# Patient Record
Sex: Male | Born: 1990 | Hispanic: Yes | Marital: Married | Smoking: Never smoker
Health system: Southern US, Community
[De-identification: ages and names within clinical notes are randomized; demographics above are authoritative.]

## PROBLEM LIST (undated history)

## (undated) DIAGNOSIS — J45909 Unspecified asthma, uncomplicated: Secondary | ICD-10-CM

---

## 2015-05-30 ENCOUNTER — Other Ambulatory Visit: Payer: Self-pay | Admitting: Internal Medicine

## 2015-05-30 ENCOUNTER — Ambulatory Visit
Admission: RE | Admit: 2015-05-30 | Discharge: 2015-05-30 | Disposition: A | Discharge status: Home / Self Care | Payer: BLUE CROSS/BLUE SHIELD | Source: Ambulatory Visit | Attending: Cardiology | Admitting: Cardiology

## 2015-05-30 ENCOUNTER — Ambulatory Visit
Admission: RE | Admit: 2015-05-30 | Discharge: 2015-05-30 | Disposition: A | Discharge status: Home / Self Care | Payer: BLUE CROSS/BLUE SHIELD | Source: Ambulatory Visit | Attending: Internal Medicine | Admitting: Internal Medicine

## 2015-05-30 DIAGNOSIS — M5440 Lumbago with sciatica, unspecified side: Secondary | ICD-10-CM

## 2015-05-30 DIAGNOSIS — M545 Low back pain: Secondary | ICD-10-CM | POA: Insufficient documentation

## 2016-01-05 ENCOUNTER — Encounter: Payer: Self-pay | Admitting: Gynecology

## 2016-01-05 ENCOUNTER — Ambulatory Visit
Admission: EM | Admit: 2016-01-05 | Discharge: 2016-01-05 | Disposition: A | Discharge status: Home / Self Care | Payer: BLUE CROSS/BLUE SHIELD | Attending: Family Medicine | Admitting: Family Medicine

## 2016-01-05 ENCOUNTER — Ambulatory Visit (INDEPENDENT_AMBULATORY_CARE_PROVIDER_SITE_OTHER): Payer: BLUE CROSS/BLUE SHIELD

## 2016-01-05 DIAGNOSIS — J40 Bronchitis, not specified as acute or chronic: Secondary | ICD-10-CM

## 2016-01-05 DIAGNOSIS — J069 Acute upper respiratory infection, unspecified: Secondary | ICD-10-CM

## 2016-01-05 HISTORY — DX: Unspecified asthma, uncomplicated: J45.909

## 2016-01-05 LAB — RAPID STREP SCREEN (MED CTR MEBANE ONLY): STREPTOCOCCUS, GROUP A SCREEN (DIRECT): NEGATIVE

## 2016-01-05 LAB — RAPID INFLUENZA A&B ANTIGENS (ARMC ONLY)
INFLUENZA A (ARMC): NEGATIVE
INFLUENZA B (ARMC): NEGATIVE

## 2016-01-05 MED ORDER — ALBUTEROL SULFATE HFA 108 (90 BASE) MCG/ACT IN AERS: 1.0000 | INHALATION_SPRAY | Freq: Four times a day (QID) | RESPIRATORY_TRACT | 0 refills | Status: DC | PRN

## 2016-01-05 MED ORDER — AZITHROMYCIN 250 MG PO TABS: 250.0000 mg | ORAL_TABLET | Freq: Every day | ORAL | 0 refills | Status: DC

## 2016-01-05 NOTE — ED Provider Notes (Signed)
CSN: [id number]     Arrival date & time 01/05/16  1419 History   First MD Initiated Contact with Patient 01/05/16 1503     Chief Complaint  Patient presents with  . Cough   (Consider location/radiation/quality/duration/timing/severity/associated sxs/prior Treatment) Pt has been sick with cough congestion wheezing for a weeks, not getting any better. States that he has been having productive yellow/ green phlem. States that he just does not feel good. Has been taking day quil with minimal relief. Has been running low grade fevers of 99 at home. Did say some guys at work have also been sick. He has been in the hospital due to his wife just had a baby 2 weeks ago when this began.        Past Medical History:  Diagnosis Date  . Asthma    No past surgical history on file. History reviewed. No pertinent family history. Social History  Substance Use Topics  . Smoking status: Never Smoker  . Smokeless tobacco: Never Used  . Alcohol use No    Review of Systems  Constitutional: Negative.   HENT: Positive for congestion, postnasal drip, rhinorrhea and sinus pressure.   Eyes: Negative.   Respiratory: Positive for cough, shortness of breath and wheezing.   Cardiovascular: Negative.   Neurological: Negative.     Allergies  Review of patient's allergies indicates no known allergies.  Home Medications   Prior to Admission medications   Medication Sig Start Date End Date Taking? Authorizing Provider  cyclobenzaprine (FLEXERIL) 10 MG tablet Take 10 mg by mouth 3 (three) times daily as needed for muscle spasms.   Yes Historical Provider, MD  albuterol (PROVENTIL HFA;VENTOLIN HFA) 108 (90 Base) MCG/ACT inhaler Inhale 1-2 puffs into the lungs every 6 (six) hours as needed for wheezing or shortness of breath. 01/05/16   Tobi Bastos, NP  azithromycin (ZITHROMAX) 250 MG tablet Take 1 tablet (250 mg total) by mouth daily. Take first 2 tablets together, then 1 every day until finished.  01/05/16   Tobi Bastos, NP   Meds Ordered and Administered this Visit  Medications - No data to display  BP 119/72 (BP Location: Left Arm)   Pulse 65   Temp 98.2 F (36.8 C) (Oral)   Resp 16   Ht 5\' 10"  (1.778 m)   Wt 185 lb (83.9 kg)   SpO2 98%   BMI 26.54 kg/m  No data found.   Physical Exam  Constitutional: He is oriented to person, place, and time. He appears well-developed and well-nourished.  HENT:  Head: Normocephalic.  Nose: Nose normal.  Eyes: EOM are normal. Pupils are equal, round, and reactive to light.  Cardiovascular: Normal rate and regular rhythm.   Pulmonary/Chest: He has wheezes.  Productive cough yellow, sob,   Neurological: He is oriented to person, place, and time.  Skin: Skin is warm.    Urgent Care Course   Clinical Course    Procedures (including critical care time)  Labs Review Labs Reviewed  RAPID INFLUENZA A&B ANTIGENS ([facility] ONLY)  RAPID STREP SCREEN (NOT AT [facility])  CULTURE, GROUP A STREP [facility])    Imaging Review Dg Chest 2 View  Result Date: 01/05/2016 CLINICAL DATA:  Pt with wheezing and cough x four days. No hx of trauma, injury or surgery. EXAM: CHEST  2 VIEW COMPARISON:  None. FINDINGS: The heart size and mediastinal contours are within normal limits. Both lungs are clear. No pleural effusion or pneumothorax. The visualized skeletal structures are unremarkable. IMPRESSION:  No active cardiopulmonary disease. Electronically Signed   By: Amie Portlandavid  Ormond M.D.   On: 01/05/2016 15:35     Visual Acuity Review          MDM   1. Acute upper respiratory infection   2. Bronchitis    Discussed using humidifier and inhaler to help with sob May use otc cough medication as needed Take full does of abx  X ray did not show any infection      Tobi BastosMelanie A Emerita Berkemeier, NP 01/05/16 1542

## 2016-01-05 NOTE — ED Triage Notes (Signed)
Per patient cough / chills / sore throat / yellowish,greensih mucous x 5 days.

## 2016-01-08 LAB — CULTURE, GROUP A STREP (THRC)

## 2016-09-22 ENCOUNTER — Encounter: Payer: Self-pay | Admitting: Emergency Medicine

## 2016-09-22 ENCOUNTER — Emergency Department: Payer: Self-pay

## 2016-09-22 ENCOUNTER — Emergency Department
Admission: EM | Admit: 2016-09-22 | Discharge: 2016-09-22 | Disposition: A | Discharge status: Home / Self Care | Payer: Self-pay | Attending: Emergency Medicine | Admitting: Emergency Medicine

## 2016-09-22 DIAGNOSIS — R197 Diarrhea, unspecified: Secondary | ICD-10-CM | POA: Insufficient documentation

## 2016-09-22 DIAGNOSIS — R112 Nausea with vomiting, unspecified: Secondary | ICD-10-CM

## 2016-09-22 DIAGNOSIS — R630 Anorexia: Secondary | ICD-10-CM

## 2016-09-22 DIAGNOSIS — R109 Unspecified abdominal pain: Secondary | ICD-10-CM

## 2016-09-22 LAB — COMPREHENSIVE METABOLIC PANEL
ALT: 23 U/L (ref 17–63)
ANION GAP: 7 (ref 5–15)
AST: 21 U/L (ref 15–41)
Albumin: 4.5 g/dL (ref 3.5–5.0)
Alkaline Phosphatase: 54 U/L (ref 38–126)
BUN: 11 mg/dL (ref 6–20)
CHLORIDE: 104 mmol/L (ref 101–111)
CO2: 27 mmol/L (ref 22–32)
CREATININE: 0.89 mg/dL (ref 0.61–1.24)
Calcium: 9.2 mg/dL (ref 8.9–10.3)
Glucose, Bld: 97 mg/dL (ref 65–99)
POTASSIUM: 3.8 mmol/L (ref 3.5–5.1)
SODIUM: 138 mmol/L (ref 135–145)
Total Bilirubin: 0.6 mg/dL (ref 0.3–1.2)
Total Protein: 7.5 g/dL (ref 6.5–8.1)

## 2016-09-22 LAB — CBC
HEMATOCRIT: 41.7 % (ref 40.0–52.0)
HEMOGLOBIN: 14.3 g/dL (ref 13.0–18.0)
MCH: 31.6 pg (ref 26.0–34.0)
MCHC: 34.3 g/dL (ref 32.0–36.0)
MCV: 92.2 fL (ref 80.0–100.0)
PLATELETS: 212 10*3/uL (ref 150–440)
RBC: 4.53 MIL/uL (ref 4.40–5.90)
RDW: 13.2 % (ref 11.5–14.5)
WBC: 6.5 10*3/uL (ref 3.8–10.6)

## 2016-09-22 LAB — URINALYSIS, COMPLETE (UACMP) WITH MICROSCOPIC
BACTERIA UA: NONE SEEN
BILIRUBIN URINE: NEGATIVE
Glucose, UA: NEGATIVE mg/dL
Hgb urine dipstick: NEGATIVE
KETONES UR: NEGATIVE mg/dL
Nitrite: NEGATIVE
PH: 6 (ref 5.0–8.0)
PROTEIN: NEGATIVE mg/dL
Specific Gravity, Urine: 1.009 (ref 1.005–1.030)

## 2016-09-22 LAB — LIPASE, BLOOD: LIPASE: 34 U/L (ref 11–51)

## 2016-09-22 MED ORDER — IOPAMIDOL (ISOVUE-300) INJECTION 61%
30.0000 mL | Freq: Once | INTRAVENOUS | Status: AC | PRN
  Administered 2016-09-22: 30 mL via ORAL

## 2016-09-22 MED ORDER — HYDROMORPHONE HCL 1 MG/ML IJ SOLN
0.5000 mg | Freq: Once | INTRAMUSCULAR | Status: AC
  Administered 2016-09-22: 0.5 mg via INTRAVENOUS
  Filled 2016-09-22: qty 1

## 2016-09-22 MED ORDER — LOPERAMIDE HCL 2 MG PO TABS: 2.0000 mg | ORAL_TABLET | Freq: Four times a day (QID) | ORAL | 0 refills | Status: DC | PRN

## 2016-09-22 MED ORDER — SODIUM CHLORIDE 0.9 % IV BOLUS (SEPSIS)
1000.0000 mL | Freq: Once | INTRAVENOUS | Status: AC
  Administered 2016-09-22: 1000 mL via INTRAVENOUS

## 2016-09-22 MED ORDER — IOPAMIDOL (ISOVUE-300) INJECTION 61%
100.0000 mL | Freq: Once | INTRAVENOUS | Status: AC | PRN
  Administered 2016-09-22: 100 mL via INTRAVENOUS

## 2016-09-22 MED ORDER — ONDANSETRON HCL 4 MG PO TABS: 4.0000 mg | ORAL_TABLET | Freq: Every day | ORAL | 0 refills | Status: AC | PRN

## 2016-09-22 MED ORDER — ONDANSETRON HCL 4 MG/2ML IJ SOLN
4.0000 mg | Freq: Once | INTRAMUSCULAR | Status: AC
  Administered 2016-09-22: 4 mg via INTRAVENOUS
  Filled 2016-09-22: qty 2

## 2016-09-22 MED ORDER — ONDANSETRON HCL 4 MG PO TABS: 4.0000 mg | ORAL_TABLET | Freq: Every day | ORAL | 1 refills | Status: DC | PRN

## 2016-09-22 NOTE — ED Provider Notes (Signed)
[facility] Emergency Department Provider Note  ____________________________________________  Time seen: Approximately 12:09 PM  I have reviewed the triage vital signs and the nursing notes.   HISTORY  Chief Complaint Emesis and Abdominal Pain    HPI Martin Burch is a 26 y.o. male, otherwise healthy without any history of abdominal surgeries in the past, presenting with abdominal pain, nausea and vomiting, diarrhea. The patient reports that on Saturday night, he ate at a restaurant where he was served oysters, lobster tail, and alligator. At approximately 2:30 AM, the patient started having vomiting and several hours later developed right-sided abdominal pain, mostly in the right upper quadrant. He had multiple episodes of vomiting throughout the day, which resolved by 6 PM last night. He also had several episodes of nonbloody diarrhea. The patient is here today, because of ongoing anorexia and pain. No fevers, chills, dysuria, testicular or scrotal symptoms.   Past Medical History:  Diagnosis Date  . Asthma     There are no active problems to display for this patient.   History reviewed. No pertinent surgical history.  Current Outpatient Rx  . Order #: [id number] Class: Normal  . Order #: [id number] Class: Normal  . Order #: [id number] Class: Print  . Order #: [id number] Class: Print    Allergies Patient has no known allergies.  No family history on file.  Social History Social History  Substance Use Topics  . Smoking status: Never Smoker  . Smokeless tobacco: Never Used  . Alcohol use No    Review of Systems Constitutional: No fever/chills.Lightheadedness or syncope. Eyes: No visual changes. ENT: No sore throat. No congestion or rhinorrhea. Cardiovascular: Denies chest pain. Denies palpitations. Respiratory: Denies shortness of breath.  No cough. Gastrointestinal: Positive right-sided abdominal pain.  Positive nausea, positive vomiting.   Positive diarrhea.  No constipation. Genitourinary: Negative for dysuria. No testicular or scrotal pain. Musculoskeletal: Negative for back pain. Skin: Negative for rash. Neurological: Negative for headaches. No focal numbness, tingling or weakness.     ____________________________________________   PHYSICAL EXAM:  VITAL SIGNS: ED Triage Vitals  Enc Vitals Group     BP 09/22/16 1139 121/84     Pulse Rate 09/22/16 1139 70     Resp 09/22/16 1139 15     Temp 09/22/16 1139 98 F (36.7 C)     Temp Source 09/22/16 1139 Oral     SpO2 09/22/16 1139 100 %     Weight 09/22/16 1140 190 lb (86.2 kg)     Height 09/22/16 1140 5\' 9"  (1.753 m)     Head Circumference --      Peak Flow --      Pain Score 09/22/16 1139 5     Pain Loc --      Pain Edu? --      Excl. in GC? --     Constitutional: Alert and oriented. Mildly uncomfortable appearing but nontoxic. Answers questions appropriately. Eyes: Conjunctivae are normal.  EOMI. No scleral icterus. Head: Atraumatic. Nose: No congestion/rhinnorhea. Mouth/Throat: Mucous membranes are moist.  Neck: No stridor.  Supple.  No meningismus. Cardiovascular: Normal rate, regular rhythm. No murmurs, rubs or gallops.  Respiratory: Normal respiratory effort.  No accessory muscle use or retractions. Lungs CTAB.  No wheezes, rales or ronchi. Gastrointestinal: Soft, and nondistended.  Tender to palpation in the right lower quadrant greater than the right upper quadrant. Negative Murphy sign. No guarding or rebound.  No peritoneal signs. Genitourinary: Deferred as the patient is not having any symptoms. Musculoskeletal: No LE  edema.  Neurologic:  A&Ox3.  Speech is clear.  Face and smile are symmetric.  EOMI.  Moves all extremities well. Skin:  Skin is warm, dry and intact. No rash noted. Psychiatric: Mood and affect are normal. Speech and behavior are normal.  Normal judgement.  ____________________________________________   LABS (all labs ordered are  listed, but only abnormal results are displayed)  Labs Reviewed  URINALYSIS, COMPLETE (UACMP) WITH MICROSCOPIC - Abnormal; Notable for the following:       Result Value   Color, Urine YELLOW (*)    APPearance CLEAR (*)    Leukocytes, UA TRACE (*)    Squamous Epithelial / LPF 0-5 (*)    All other components within normal limits  LIPASE, BLOOD  COMPREHENSIVE METABOLIC PANEL  CBC   ____________________________________________  EKG  Not indicated ____________________________________________  RADIOLOGY  Ct Abdomen Pelvis W Contrast  Result Date: 09/22/2016 CLINICAL DATA:  26 y/o  M; right upper quadrant pain. EXAM: CT ABDOMEN AND PELVIS WITH CONTRAST TECHNIQUE: Multidetector CT imaging of the abdomen and pelvis was performed using the standard protocol following bolus administration of intravenous contrast. CONTRAST:  ISOVUE-300 IOPAMIDOL (ISOVUE-300) INJECTION 61% COMPARISON:  None. FINDINGS: Lower chest: No acute abnormality. Hepatobiliary: No focal liver abnormality is seen. No gallstones, gallbladder wall thickening, or biliary dilatation. Pancreas: Unremarkable. No pancreatic ductal dilatation or surrounding inflammatory changes. Spleen: Normal in size without focal abnormality. Adrenals/Urinary Tract: Adrenal glands are unremarkable. Kidneys are normal, without renal calculi, focal lesion, or hydronephrosis. Bladder is unremarkable. Stomach/Bowel: Stomach is within normal limits. Appendix appears normal. No evidence of bowel wall thickening, distention, or inflammatory changes. Vascular/Lymphatic: No significant vascular findings are present. No enlarged abdominal or pelvic lymph nodes. Reproductive: Prostate is unremarkable. Other: No abdominal wall hernia or abnormality. No abdominopelvic ascites. Musculoskeletal: No acute or significant osseous findings. IMPRESSION: No acute process identified as explanation for pain. Unremarkable CT of abdomen and pelvis. Electronically Signed    By: Mitzi Hansen M.D.   On: 09/22/2016 14:09    ____________________________________________   PROCEDURES  Procedure(s) performed: None  Procedures  Critical Care performed: No ____________________________________________   INITIAL IMPRESSION / ASSESSMENT AND PLAN / ED COURSE  Pertinent labs & imaging results that were available during my care of the patient were reviewed by me and considered in my medical decision making (see chart for details).  26 y.o. male presenting with nausea vomiting and diarrhea, right lower quadrant greater than right upper quadrant pain. Overall, the patient is hemodynamically stable and afebrile. Given his abdominal examination, I'm concerned about gallbladder as well as appendix pathology. Will get a CT scan for further evaluation. I'm awaiting the results of his laboratory studies. In the meantime, I'll treat him symptomatically and plan reevaluation for final disposition.  ----------------------------------------- 2:34 PM on 09/22/2016 -----------------------------------------  The patient's workup has been reassuring in the emergency department, his pain completely resolved after 1 dose of pain medication. He has been able to tolerate liquid without any difficulty, and has not had any further diarrheal episodes. His CT abdomen does not show any acute abdominal process. Plan discharge at this time. Return precautions were discussed.  ____________________________________________  FINAL CLINICAL IMPRESSION(S) / ED DIAGNOSES  Final diagnoses:  Nausea vomiting and diarrhea  Right sided abdominal pain  Anorexia         NEW MEDICATIONS STARTED DURING THIS VISIT:  Current Discharge Medication List    START taking these medications   Details  loperamide (IMODIUM A-D) 2 MG tablet Take  1 tablet (2 mg total) by mouth 4 (four) times daily as needed for diarrhea or loose stools. Qty: 12 tablet, Refills: 0    ondansetron (ZOFRAN) 4 MG  tablet Take 1 tablet (4 mg total) by mouth daily as needed for nausea or vomiting. Qty: 12 tablet, Refills: 0          Rockne MenghiniNorman, Anne-Caroline, MD 09/22/16 1436

## 2016-09-22 NOTE — ED Notes (Signed)
Pt returned from CT, resting in bed in no distress 

## 2016-09-22 NOTE — ED Notes (Signed)
Patient transported to CT 

## 2016-09-22 NOTE — ED Notes (Signed)
Pt resting in bed, denies any needs, states relief with pain meds, resp even and unlabored

## 2016-09-22 NOTE — ED Triage Notes (Signed)
Reports emesis since Sunday, RUQ pain. Pt reports symptoms have somewhat resolved but still continues to have decreased appetite, reports RUQ pain continues.

## 2016-09-22 NOTE — Discharge Instructions (Signed)
Please take a clear liquid diet for the next 24 hours, then advance to a bland diet as tolerated. You may take Zofran for nausea and vomiting, and loperamide is for diarrhea.  Return to the emergency department if you develop severe pain, lightheadedness or fainting, fever, inability to keep down fluids, or any other symptoms concerning to you

## 2017-05-31 ENCOUNTER — Emergency Department: Payer: BLUE CROSS/BLUE SHIELD

## 2017-05-31 ENCOUNTER — Encounter: Payer: Self-pay | Admitting: Emergency Medicine

## 2017-05-31 ENCOUNTER — Other Ambulatory Visit: Payer: Self-pay

## 2017-05-31 ENCOUNTER — Emergency Department
Admission: EM | Admit: 2017-05-31 | Discharge: 2017-05-31 | Disposition: A | Discharge status: Home / Self Care | Payer: BLUE CROSS/BLUE SHIELD | Attending: Emergency Medicine | Admitting: Emergency Medicine

## 2017-05-31 DIAGNOSIS — R001 Bradycardia, unspecified: Secondary | ICD-10-CM | POA: Diagnosis not present

## 2017-05-31 DIAGNOSIS — R079 Chest pain, unspecified: Secondary | ICD-10-CM | POA: Diagnosis not present

## 2017-05-31 DIAGNOSIS — J45909 Unspecified asthma, uncomplicated: Secondary | ICD-10-CM | POA: Diagnosis not present

## 2017-05-31 LAB — TROPONIN I

## 2017-05-31 LAB — CBC
HCT: 40.5 % (ref 40.0–52.0)
HEMOGLOBIN: 13.6 g/dL (ref 13.0–18.0)
MCH: 32.2 pg (ref 26.0–34.0)
MCHC: 33.6 g/dL (ref 32.0–36.0)
MCV: 95.7 fL (ref 80.0–100.0)
PLATELETS: 221 10*3/uL (ref 150–440)
RBC: 4.23 MIL/uL — ABNORMAL LOW (ref 4.40–5.90)
RDW: 13.3 % (ref 11.5–14.5)
WBC: 5.9 10*3/uL (ref 3.8–10.6)

## 2017-05-31 LAB — BASIC METABOLIC PANEL
Anion gap: 4 — ABNORMAL LOW (ref 5–15)
BUN: 16 mg/dL (ref 6–20)
CALCIUM: 8.6 mg/dL — AB (ref 8.9–10.3)
CO2: 25 mmol/L (ref 22–32)
Chloride: 106 mmol/L (ref 101–111)
Creatinine, Ser: 1.01 mg/dL (ref 0.61–1.24)
Glucose, Bld: 95 mg/dL (ref 65–99)
Potassium: 3.7 mmol/L (ref 3.5–5.1)
Sodium: 135 mmol/L (ref 135–145)

## 2017-05-31 MED ORDER — GI COCKTAIL ~~LOC~~
30.0000 mL | Freq: Once | ORAL | Status: AC
  Administered 2017-05-31: 30 mL via ORAL
  Filled 2017-05-31: qty 30

## 2017-05-31 NOTE — ED Provider Notes (Signed)
[facility] Emergency Department Provider Note  ____________________________________________  Time seen: Approximately 9:59 AM  I have reviewed the triage vital signs and the nursing notes.   HISTORY  Chief Complaint Chest Pain    HPI Martin Burch is a 27 y.o. male with occasional cannabis use but otherwise healthy presenting with chest pain.  The patient reports that at 230 this morning, he developed a sharp slightly left-sided chest pain that did not radiate.  He did not have any associated diaphoresis, nausea or vomiting, palpitations, shortness of breath, lightheadedness or syncope.  The patient now reports that the pain is a dull ache.  The pain is nonpleuritic.  The pain is not related to food, he has no acid sensation no burping, is not positional, and he is unable to reproduce it with palpation.  No new exercise or trauma.  He has no lower extremity swelling or calf pain.  No personal or family history of blood clots.  The patient denies cocaine use.  Pt has not tried anything for symptoms.  Fh: Cousin with acute MI and death in the 30s.  Past Medical History:  Diagnosis Date  . Asthma     There are no active problems to display for this patient.   History reviewed. No pertinent surgical history.  Current Outpatient Rx  . Order #: [id number] Class: Normal  . Order #: [id number] Class: Normal  . Order #: [id number] Class: Print  . Order #: [id number] Class: Print    Allergies Patient has no known allergies.  History reviewed. No pertinent family history.  Social History Social History   Tobacco Use  . Smoking status: Never Smoker  . Smokeless tobacco: Never Used  Substance Use Topics  . Alcohol use: No  . Drug use: Yes    Types: Marijuana    Review of Systems Constitutional: No fever/chills.  No lightheadedness or syncope.  No diaphoresis. Eyes: No visual changes. ENT: No sore throat. No congestion or rhinorrhea. Cardiovascular:  Positive chest pain. Denies palpitations. Respiratory: Denies shortness of breath.  No cough. Gastrointestinal: No abdominal pain.  No nausea, no vomiting.  No diarrhea.  No constipation. Genitourinary: Negative for dysuria. Musculoskeletal: Negative for back pain..  No calf pain or tenderness to palpation. Skin: Negative for rash. Neurological: Negative for headaches. No focal numbness, tingling or weakness.     ____________________________________________   PHYSICAL EXAM:  VITAL SIGNS: ED Triage Vitals  Enc Vitals Group     BP 05/31/17 0559 128/82     Pulse Rate 05/31/17 0559 (!) 55     Resp 05/31/17 0559 16     Temp 05/31/17 0559 98 F (36.7 C)     Temp Source 05/31/17 0559 Oral     SpO2 05/31/17 0559 100 %     Weight 05/31/17 0556 175 lb (79.4 kg)     Height 05/31/17 0556 5\' 9"  (1.753 m)     Head Circumference --      Peak Flow --      Pain Score 05/31/17 0556 8     Pain Loc --      Pain Edu? --      Excl. in GC? --     Constitutional: Alert and oriented. Well appearing and in no acute distress. Answers questions appropriately. Eyes: Conjunctivae are normal.  EOMI. No scleral icterus. Head: Atraumatic. Nose: No congestion/rhinnorhea. Mouth/Throat: Mucous membranes are moist.  Neck: No stridor.  Supple.  No JVD.  No meningismus. Cardiovascular: Slow rate, regular rhythm. No murmurs, rubs or  gallops.  Respiratory: Normal respiratory effort.  No accessory muscle use or retractions. Lungs CTAB.  No wheezes, rales or ronchi. Gastrointestinal: Soft, nontender and nondistended.  No guarding or rebound.  No peritoneal signs. Musculoskeletal: No LE edema. No ttp in the calves or palpable cords.  Negative Homan's sign. Neurologic:  A&Ox3.  Speech is clear.  Face and smile are symmetric.  EOMI.  Moves all extremities well. Skin:  Skin is warm, dry and intact. No rash noted. Psychiatric: Mood and affect are normal. Speech and behavior are normal.  Normal  judgement.  ____________________________________________   LABS (all labs ordered are listed, but only abnormal results are displayed)  Labs Reviewed  BASIC METABOLIC PANEL - Abnormal; Notable for the following components:      Result Value   Calcium 8.6 (*)    Anion gap 4 (*)    All other components within normal limits  CBC - Abnormal; Notable for the following components:   RBC 4.23 (*)    All other components within normal limits  TROPONIN I  TROPONIN I   ____________________________________________  EKG  .ED ECG REPORT I, Rockne Menghini, the attending physician, personally viewed and interpreted this ECG.   Date: 05/31/2017  EKG Time: 558  Rate: 51  Rhythm: sinus bradycardia  Axis: normal  Intervals:none  ST&T Change: No STEMI; no evidence of Brugada syndrome, prolonged QTC or hypertrophy.  ____________________________________________  RADIOLOGY  Dg Chest 2 View  Result Date: 05/31/2017 CLINICAL DATA:  27 year old male with chest pain. EXAM: CHEST  2 VIEW COMPARISON:  Chest radiograph dated 01/05/2016 FINDINGS: The heart size and mediastinal contours are within normal limits. Both lungs are clear. The visualized skeletal structures are unremarkable. IMPRESSION: No active cardiopulmonary disease. Electronically Signed   By: Elgie Collard M.D.   On: 05/31/2017 06:33    ____________________________________________   PROCEDURES  Procedure(s) performed: None  Procedures  Critical Care performed: No ____________________________________________   INITIAL IMPRESSION / ASSESSMENT AND PLAN / ED COURSE  Pertinent labs & imaging results that were available during my care of the patient were reviewed by me and considered in my medical decision making (see chart for details).  27 y.o. male, otherwise healthy, presenting with atypical chest pain.  Overall, the patient is hemodynamically stable.  He does have a sinus bradycardia on his EKG, which is likely  due to his age and good health; there is no evidence of dangerous arrhythmia or ischemic changes.  I do not see any evidence of Brugada syndrome, prolonged QTC or hypertrophy.  The patient's symptoms are not consistent with a GI cause, as there is no positional change, acid sensation, or change with food.  He has not had any reason for musculoskeletal related illness.  The patient has not been having any infectious symptoms.  We will plan to repeat the patient's troponin to be well out of the 4-hour window, and treat the patient with a GI cocktail to see if this improves his symptoms.  I have asked him to avoid NSAID medications until he can be evaluated by his primary care physician.  I anticipate the patient will be able to be discharged home with PMD follow-up.  Return precautions were discussed with the patient and his wife.  ____________________________________________  FINAL CLINICAL IMPRESSION(S) / ED DIAGNOSES  Final diagnoses:  Chest pain, unspecified type  Sinus bradycardia         NEW MEDICATIONS STARTED DURING THIS VISIT:  New Prescriptions   No medications on file  Rockne MenghiniNorman, Anne-Caroline, MD 05/31/17 1005

## 2017-05-31 NOTE — Discharge Instructions (Signed)
Please return to the emergency department if you develop severe pain, lightheadedness or fainting, fever, or any other symptoms concerning to you.

## 2017-05-31 NOTE — ED Notes (Signed)
Pt sitting up in bed, playing on phone. No needs or concerns voiced. Will continue to monitor.

## 2017-05-31 NOTE — ED Triage Notes (Signed)
Pt complains of left sided chest pain that extends to left shoulder since 0300. Pt denies dizziness, diaphoresis, nausea, vomiting, shob. Pt appears in no acute distress.

## 2017-09-13 ENCOUNTER — Telehealth: Payer: Self-pay

## 2017-09-13 NOTE — Telephone Encounter (Signed)
Patient's wife-Diorella called stating that she wanted to pay for her husband's disability form fee. Please call as soon as you can.

## 2017-09-13 NOTE — Telephone Encounter (Signed)
Paperwork has been placed in folder up front and payment has been paid for paperwork to be filled out. Also the patient would like a copy for there records.

## 2017-12-14 ENCOUNTER — Encounter: Payer: Self-pay | Admitting: Emergency Medicine

## 2017-12-14 ENCOUNTER — Emergency Department: Payer: BLUE CROSS/BLUE SHIELD

## 2017-12-14 DIAGNOSIS — J45909 Unspecified asthma, uncomplicated: Secondary | ICD-10-CM | POA: Insufficient documentation

## 2017-12-14 DIAGNOSIS — Z79899 Other long term (current) drug therapy: Secondary | ICD-10-CM | POA: Diagnosis not present

## 2017-12-14 DIAGNOSIS — J209 Acute bronchitis, unspecified: Secondary | ICD-10-CM | POA: Insufficient documentation

## 2017-12-14 DIAGNOSIS — R079 Chest pain, unspecified: Secondary | ICD-10-CM | POA: Diagnosis present

## 2017-12-14 LAB — BASIC METABOLIC PANEL
ANION GAP: 7 (ref 5–15)
BUN: 14 mg/dL (ref 6–20)
CALCIUM: 9.3 mg/dL (ref 8.9–10.3)
CO2: 26 mmol/L (ref 22–32)
Chloride: 102 mmol/L (ref 98–111)
Creatinine, Ser: 0.99 mg/dL (ref 0.61–1.24)
GFR calc non Af Amer: >60 mL/min (ref >60)
Glucose, Bld: 110 mg/dL — ABNORMAL HIGH (ref 70–99)
Potassium: 3.5 mmol/L (ref 3.5–5.1)
SODIUM: 135 mmol/L (ref 135–145)

## 2017-12-14 LAB — CBC
HCT: 41.8 % (ref 40.0–52.0)
HEMOGLOBIN: 14.8 g/dL (ref 13.0–18.0)
MCH: 32.8 pg (ref 26.0–34.0)
MCHC: 35.4 g/dL (ref 32.0–36.0)
MCV: 92.6 fL (ref 80.0–100.0)
PLATELETS: 205 10*3/uL (ref 150–440)
RBC: 4.51 MIL/uL (ref 4.40–5.90)
RDW: 12.9 % (ref 11.5–14.5)
WBC: 10.2 10*3/uL (ref 3.8–10.6)

## 2017-12-14 LAB — TROPONIN I: Troponin I: <0.03 ng/mL (ref <0.03)

## 2017-12-14 NOTE — ED Triage Notes (Signed)
Pt c/o cough and congestion x2 days, unrelieved by OTC medications. Pt reports tonight centra, non radiating chest pain occurred.

## 2017-12-15 ENCOUNTER — Emergency Department
Admission: EM | Admit: 2017-12-15 | Discharge: 2017-12-15 | Disposition: A | Discharge status: Home / Self Care | Payer: BLUE CROSS/BLUE SHIELD | Attending: Emergency Medicine | Admitting: Emergency Medicine

## 2017-12-15 DIAGNOSIS — J209 Acute bronchitis, unspecified: Secondary | ICD-10-CM

## 2017-12-15 MED ORDER — AZITHROMYCIN 500 MG PO TABS: 500.0000 mg | ORAL_TABLET | Freq: Every day | ORAL | 0 refills | Status: AC

## 2017-12-15 MED ORDER — ALBUTEROL SULFATE HFA 108 (90 BASE) MCG/ACT IN AERS: 2.0000 | INHALATION_SPRAY | Freq: Four times a day (QID) | RESPIRATORY_TRACT | 0 refills | Status: AC | PRN

## 2017-12-15 MED ORDER — HYDROCOD POLST-CPM POLST ER 10-8 MG/5ML PO SUER
5.0000 mL | Freq: Once | ORAL | Status: AC
  Administered 2017-12-15: 5 mL via ORAL
  Filled 2017-12-15: qty 5

## 2017-12-15 MED ORDER — AZITHROMYCIN 500 MG PO TABS
500.0000 mg | ORAL_TABLET | Freq: Once | ORAL | Status: AC
  Administered 2017-12-15: 500 mg via ORAL
  Filled 2017-12-15: qty 1

## 2017-12-15 MED ORDER — BENZONATATE 100 MG PO CAPS: 100.0000 mg | ORAL_CAPSULE | Freq: Three times a day (TID) | ORAL | 0 refills | Status: AC | PRN

## 2017-12-27 NOTE — ED Provider Notes (Signed)
[facility] Emergency Department Provider Note    First MD Initiated Contact with Patient 12/15/17 [phone]     (approximate)  I have reviewed the triage vital signs and the nursing notes.   HISTORY  Chief Complaint Chest Pain and Cough    HPI Martin Burch is a 27 y.o. male presents to the emergency department with productive cough and nasal congestion x2 days unrelieved by over-the-counter medications.  Patient also admits to central chest discomfort with coughing which began tonight.  Patient denies any fever afebrile on present   Past Medical History:  Diagnosis Date  . Asthma     There are no active problems to display for this patient.   History reviewed. No pertinent surgical history.  Prior to Admission medications   Medication Sig Start Date End Date Taking? Authorizing Provider  albuterol (PROVENTIL HFA;VENTOLIN HFA) 108 (90 Base) MCG/ACT inhaler Inhale 1-2 puffs into the lungs every 6 (six) hours as needed for wheezing or shortness of breath. Patient not taking: Reported on 05/31/2017 01/05/16   Coralyn MarkMitchell, Melanie L, NP  albuterol (PROVENTIL HFA;VENTOLIN HFA) 108 (90 Base) MCG/ACT inhaler Inhale 2 puffs into the lungs every 6 (six) hours as needed for wheezing or shortness of breath. 12/15/17   Darci CurrentBrown, Ortley N, MD  azithromycin (ZITHROMAX) 250 MG tablet Take 1 tablet (250 mg total) by mouth daily. Take first 2 tablets together, then 1 every day until finished. Patient not taking: Reported on 09/22/2016 01/05/16   Coralyn MarkMitchell, Melanie L, NP  loperamide (IMODIUM A-D) 2 MG tablet Take 1 tablet (2 mg total) by mouth 4 (four) times daily as needed for diarrhea or loose stools. Patient not taking: Reported on 05/31/2017 09/22/16   Rockne MenghiniNorman, Anne-Caroline, MD    Allergies No known drug allergies History reviewed. No pertinent family history.  Social History Social History   Tobacco Use  . Smoking status: Never Smoker  . Smokeless tobacco: Never Used   Substance Use Topics  . Alcohol use: No  . Drug use: Yes    Types: Marijuana    Review of Systems Constitutional: No fever/chills Eyes: No visual changes. ENT: No sore throat.  Positive for nasal congestion Cardiovascular: Denies chest pain. Respiratory: Positive for cough Gastrointestinal: No abdominal pain.  No nausea, no vomiting.  No diarrhea.  No constipation. Genitourinary: Negative for dysuria. Musculoskeletal: Negative for neck pain.  Negative for back pain. Integumentary: Negative for rash. Neurological: Negative for headaches, focal weakness or numbness.   ____________________________________________   PHYSICAL EXAM:  VITAL SIGNS: ED Triage Vitals  Enc Vitals Group     BP 12/14/17 2235 136/82     Pulse Rate 12/14/17 2235 96     Resp 12/14/17 2235 18     Temp 12/14/17 2235 99.5 F (37.5 C)     Temp Source 12/14/17 2235 Oral     SpO2 12/14/17 2235 99 %     Weight --      Height --      Head Circumference --      Peak Flow --      Pain Score 12/15/17 0411 4     Pain Loc --      Pain Edu? --      Excl. in GC? --     Constitutional: Alert and oriented. Well appearing and in no acute distress. Eyes: Conjunctivae are normal. Head: Atraumatic. Mouth/Throat: Mucous membranes are moist.  Oropharynx non-erythematous. Neck: No stridor.   Cardiovascular: Normal rate, regular rhythm. Good peripheral circulation.  Grossly normal heart sounds. Respiratory: Normal respiratory effort.  No retractions. Lungs CTAB. Gastrointestinal: Soft and nontender. No distention.   Musculoskeletal: No lower extremity tenderness nor edema. No gross deformities of extremities. Neurologic:  Normal speech and language. No gross focal neurologic deficits are appreciated.  Skin:  Skin is warm, dry and intact. No rash noted. Psychiatric: Mood and affect are normal. Speech and behavior are normal.  ____________________________________________   LABS (all labs ordered are listed, but  only abnormal results are displayed)  Labs Reviewed  BASIC METABOLIC PANEL - Abnormal; Notable for the following components:      Result Value   Glucose, Bld 110 (*)    All other components within normal limits  CBC  TROPONIN I     RADIOLOGY I, [place] Hedy Garro, personally viewed and evaluated these images (plain radiographs) as part of my medical decision making, as well as reviewing the written report by the radiologist.  ED MD interpretation:  No acute pulmonary process identified.   CLINICAL DATA:  27 y/o  M; 2 days of cough and congestion.  EXAM: CHEST - 2 VIEW  COMPARISON:  05/31/2017 chest radiograph  FINDINGS: Stable heart size and mediastinal contours are within normal limits. Both lungs are clear. The visualized skeletal structures are unremarkable.  IMPRESSION: No acute pulmonary process identified.   Electronically Signed   By: Mitzi Hansen M.D.   On: 12/14/2017 23:24   Procedures   ____________________________________________   INITIAL IMPRESSION / ASSESSMENT AND PLAN / ED COURSE  As part of my medical decision making, I reviewed the following data within the electronic MEDICAL RECORD NUMBER   27 year old male presenting with above-stated history and physical otitis versus pneumonia chest x-ray unremarkable.  Patient given Tussionex and azithromycin in the emergency department will be prescribed azithromycin and Tessalon Perles for home secondary to concern for possible bronchitis. ___________________________________  FINAL CLINICAL IMPRESSION(S) / ED DIAGNOSES  Final diagnoses:  Acute bronchitis, unspecified organism     MEDICATIONS GIVEN DURING THIS VISIT:  Medications  azithromycin (ZITHROMAX) tablet 500 mg (500 mg Oral Given 12/15/17 0345)  chlorpheniramine-HYDROcodone (TUSSIONEX) 10-8 MG/5ML suspension 5 mL (5 mLs Oral Given 12/15/17 0345)     ED Discharge Orders         Ordered    azithromycin (ZITHROMAX) 500 MG  tablet  Daily     12/15/17 0403    benzonatate (TESSALON PERLES) 100 MG capsule  3 times daily PRN     12/15/17 0403    albuterol (PROVENTIL HFA;VENTOLIN HFA) 108 (90 Base) MCG/ACT inhaler  Every 6 hours PRN     12/15/17 0403           Note:  This document was prepared using Dragon voice recognition software and may include unintentional dictation errors.    Darci Current, MD 12/27/17 [phone]

## 2019-11-30 IMAGING — CR DG CHEST 2V
2 series · 2 of 2 positions shown · non-contrast
Comparison: 05/31/2017 chest radiograph

CLINICAL DATA: 27 y/o  M; 2 days of cough and congestion.

EXAM:
CHEST - 2 VIEW

[chest pa]
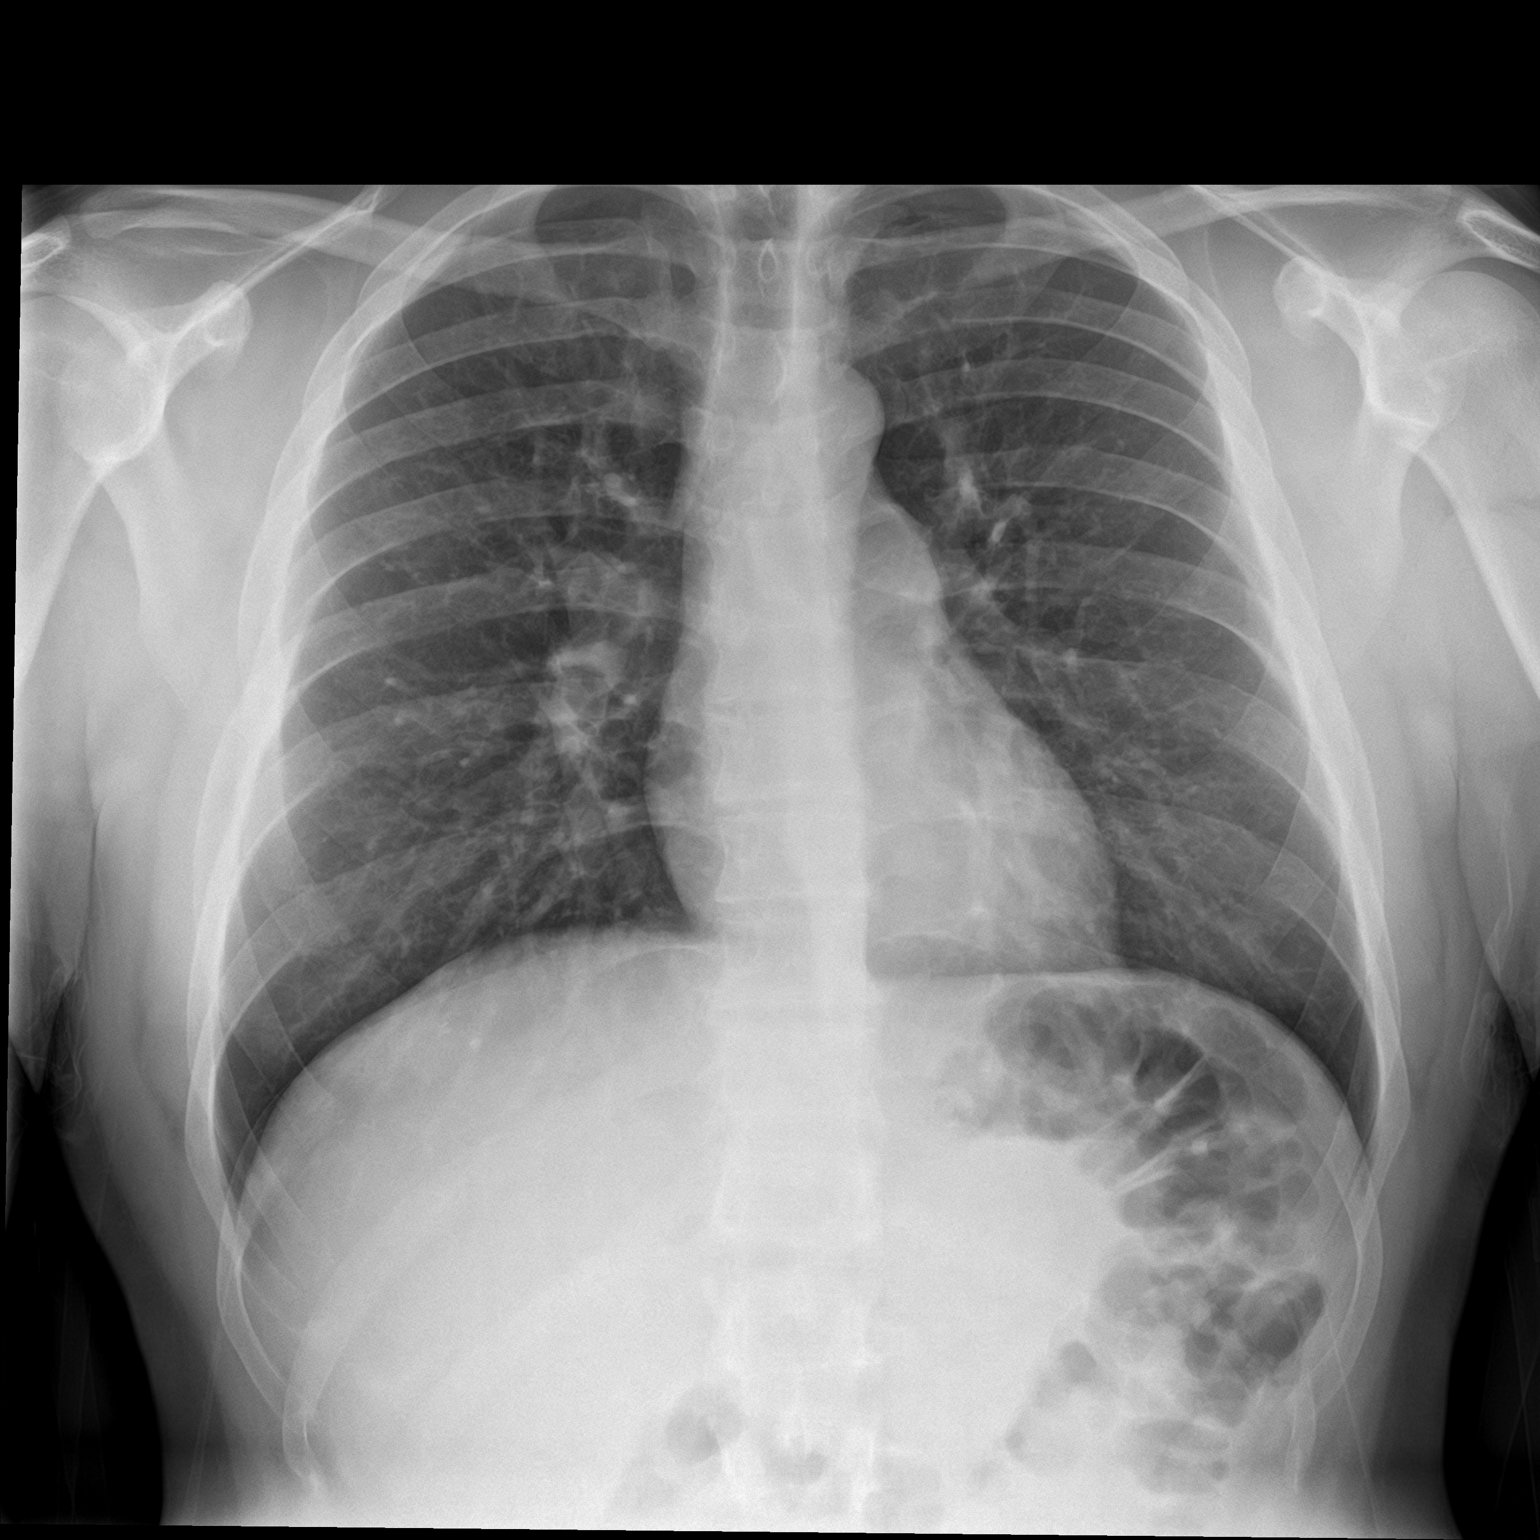

[chest lat]
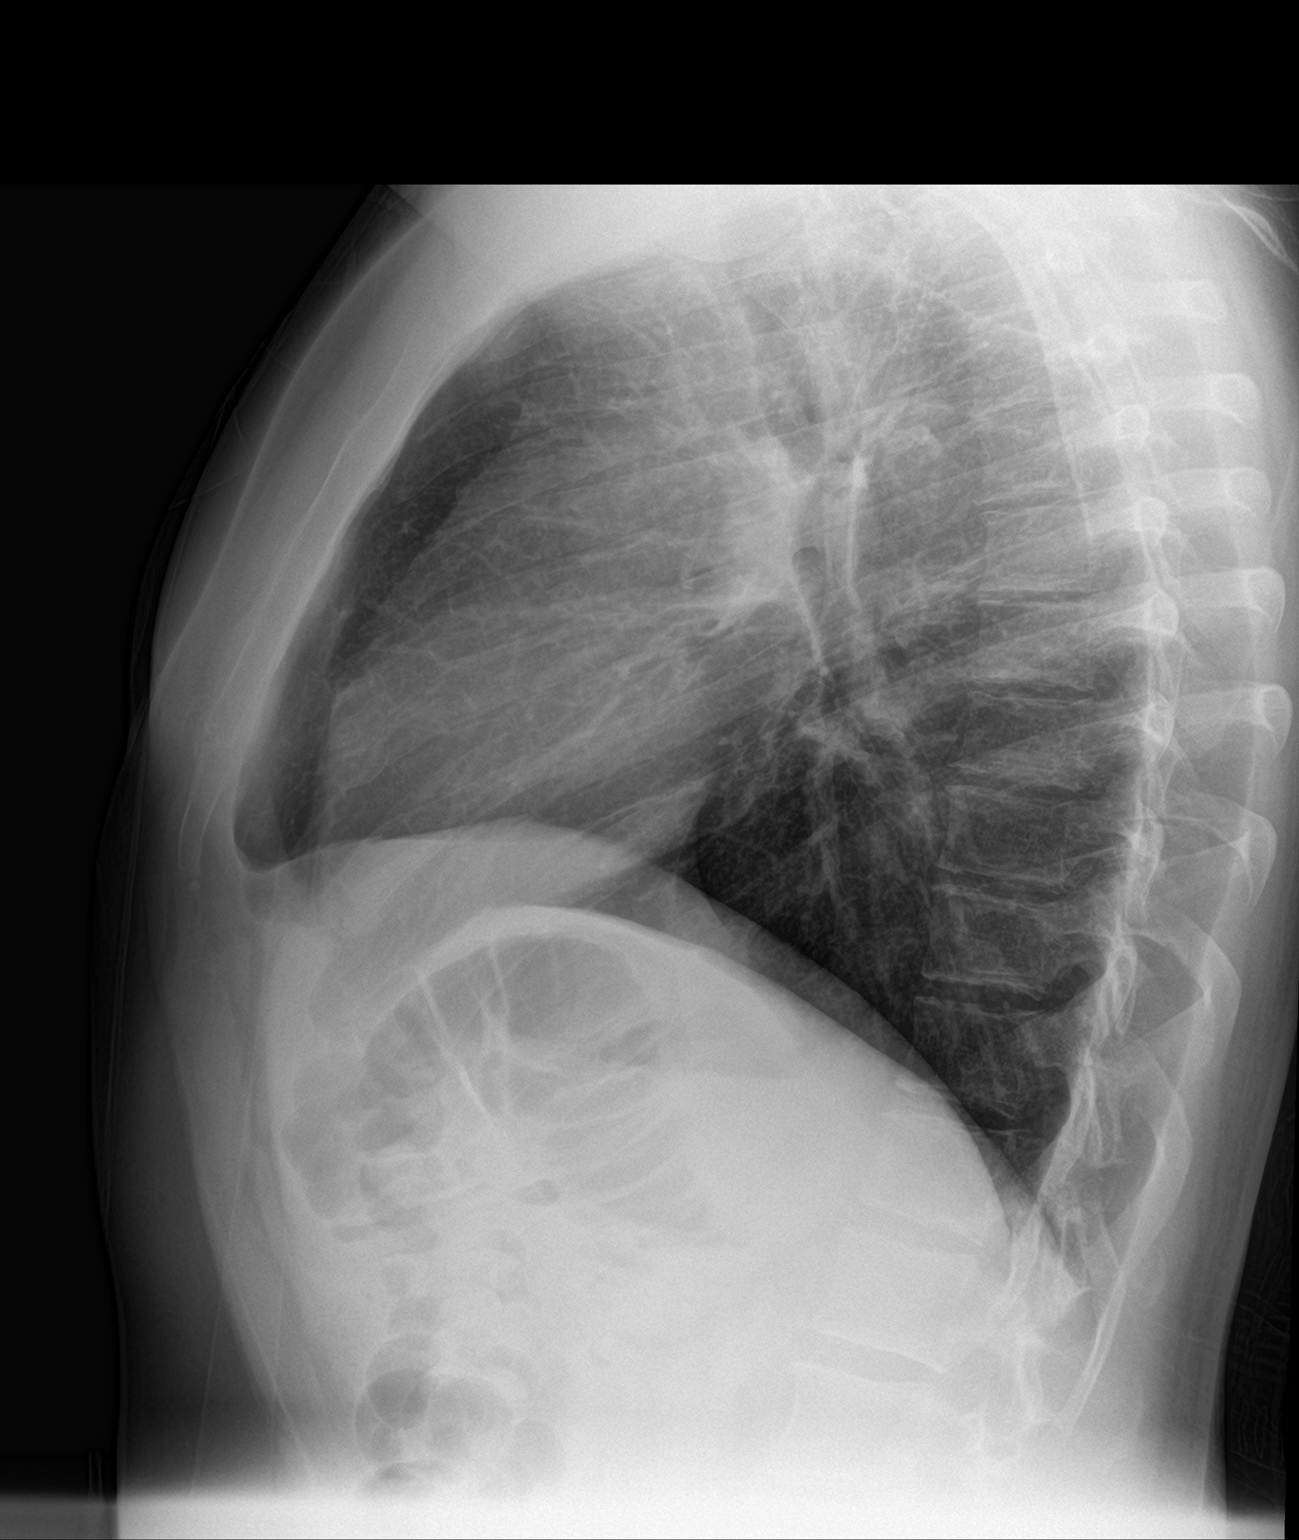

[2 of 2 positions shown; findings below may reference images not displayed]

FINDINGS: Stable heart size and mediastinal contours are within normal limits.
Both lungs are clear. The visualized skeletal structures are
unremarkable.
IMPRESSION: No acute pulmonary process identified.

By: [place]a Sams[place]ite M.D.

## 2022-07-30 DIAGNOSIS — F17200 Nicotine dependence, unspecified, uncomplicated: Secondary | ICD-10-CM | POA: Insufficient documentation

## 2022-07-30 DIAGNOSIS — M5416 Radiculopathy, lumbar region: Secondary | ICD-10-CM | POA: Insufficient documentation

## 2023-05-19 ENCOUNTER — Ambulatory Visit
Admission: RE | Admit: 2023-05-19 | Discharge: 2023-05-19 | Disposition: A | Discharge status: Home / Self Care | Payer: BC Managed Care – PPO | Source: Ambulatory Visit | Attending: Family Medicine | Admitting: Family Medicine

## 2023-05-19 VITALS — BP 139/87 | HR 87 | Temp 100.1°F | Resp 16 | Ht 69.0 in | Wt 184.0 lb

## 2023-05-19 DIAGNOSIS — J111 Influenza due to unidentified influenza virus with other respiratory manifestations: Secondary | ICD-10-CM

## 2023-05-19 DIAGNOSIS — J029 Acute pharyngitis, unspecified: Secondary | ICD-10-CM | POA: Diagnosis not present

## 2023-05-19 LAB — GROUP A STREP BY PCR: Group A Strep by PCR: NOT DETECTED

## 2023-05-19 NOTE — ED Triage Notes (Signed)
Pt c/o sore throat x4 days. Had a fever & loss of appetite which has subsided. Tmax 101.1 on Monday.

## 2023-05-19 NOTE — ED Provider Notes (Signed)
[facility]    CSN: [id number] Arrival date & time: 05/19/23  0815      History   Chief Complaint Chief Complaint  Patient presents with   Sore Throat    Appt   Fever    HPI Zoe Nordin is a 33 y.o. male.   HPI  History obtained from the patient. [place] presents for fever, body aches, chills, decreased appetite that started on Saturday. Tmax 101.1 F.   Yesterday, he was still having fever.  Has pain with swallowing liquids or solids. No vomiting.  Has diarrhea. Has been taking Tylneol, DayQuil, NyQuil, and Motrin.    Has history of asthma.      Past Medical History:  Diagnosis Date   Asthma     Patient Active Problem List   Diagnosis Date Noted   Lumbar back pain with radiculopathy affecting left lower extremity 07/30/2022   Tobacco dependence 07/30/2022    History reviewed. No pertinent surgical history.     Home Medications    Prior to Admission medications   Medication Sig Start Date End Date Taking? Authorizing Provider  meloxicam (MOBIC) 15 MG tablet Take 1 tablet (15 mg total) by mouth once daily 09/01/22 09/01/23 Yes [provider]  tiZANidine (ZANAFLEX) 2 MG tablet Take 1 tablet (2 mg total) by mouth 3 (three) times daily 09/01/22 09/01/23 Yes [provider]  albuterol (PROVENTIL HFA;VENTOLIN HFA) 108 (90 Base) MCG/ACT inhaler Inhale 2 puffs into the lungs every 6 (six) hours as needed for wheezing or shortness of breath. 12/15/17   Darci Current, MD    Family History History reviewed. No pertinent family history.  Social History Social History   Tobacco Use   Smoking status: Never   Smokeless tobacco: Never  Vaping Use   Vaping status: Never Used  Substance Use Topics   Alcohol use: No   Drug use: Yes    Types: Marijuana     Allergies   Patient has no known allergies.   Review of Systems Review of Systems: negative unless otherwise stated in HPI.      Physical Exam Triage Vital  Signs ED Triage Vitals  Encounter Vitals Group     BP 05/19/23 0854 139/87     Systolic BP Percentile --      Diastolic BP Percentile --      Pulse Rate 05/19/23 0854 87     Resp 05/19/23 0854 16     Temp 05/19/23 0854 100.1 F (37.8 C)     Temp Source 05/19/23 0854 Oral     SpO2 05/19/23 0854 99 %     Weight 05/19/23 0853 184 lb (83.5 kg)     Height 05/19/23 0853 5\' 9"  (1.753 m)     Head Circumference --      Peak Flow --      Pain Score 05/19/23 0858 8     Pain Loc --      Pain Education --      Exclude from Growth Chart --    No data found.  Updated Vital Signs BP 139/87 (BP Location: Left Arm)   Pulse 87   Temp 100.1 F (37.8 C) (Oral)   Resp 16   Ht 5\' 9"  (1.753 m)   Wt 83.5 kg   SpO2 99%   BMI 27.17 kg/m   Visual Acuity Right Eye Distance:   Left Eye Distance:   Bilateral Distance:    Right Eye Near:   Left Eye Near:  Bilateral Near:     Physical Exam GEN:     alert, non-toxic appearing male in no distress    HENT:  mucus membranes moist, oropharyngeal without lesions, moderate erythema, no tonsillar hypertrophy or exudates, no nasal discharge, bilateral TM normal EYES:   pupils equal and reactive, no scleral injection or discharge NECK:  normal ROM, + anterior and posterior lymphadenopathy, no meningismus   RESP:  no increased work of breathing, clear to auscultation bilaterally CVS:   regular rate and rhythm Skin:   warm and dry, no rash on visible skin    UC Treatments / Results  Labs (all labs ordered are listed, but only abnormal results are displayed) Labs Reviewed  GROUP A STREP BY PCR    EKG   Radiology No results found.  Procedures Procedures (including critical care time)  Medications Ordered in UC Medications - No data to display  Initial Impression / Assessment and Plan / UC Course  I have reviewed the triage vital signs and the nursing notes.  Pertinent labs & imaging results that were available during my care of the  patient were reviewed by me and considered in my medical decision making (see chart for details).       Pt is a 33 y.o. male who presents for 4 days of respiratory symptoms. [place] has an elevated temperature here at 100.1 F without recent antipyretics. Satting well on room air. Overall pt is non-toxic appearing, well hydrated, without respiratory distress. Pulmonary exam is unremarkable.  COVID and influenza panel deferred due to duration of symptoms as he is outside the treatment window fo both of these. Strep pcr obtained and was negative.   Suspect viral respiratory infection. Discussed symptomatic treatment.  Explained lack of efficacy of antibiotics in viral disease.  Typical duration of symptoms discussed. Work note provided. Lidocaine solution offered but declined.   Return and ED precautions given and voiced understanding. Discussed MDM, treatment plan and plan for follow-up with patient who agrees with plan.     Final Clinical Impressions(s) / UC Diagnoses   Final diagnoses:  Influenza-like illness  Pharyngitis, unspecified etiology     Discharge Instructions      Your strep test is negative.  You have a viral respiratory infection (even possibly influenza or COVID) that will gradually improve over the next 7-10 days. Cough may last up to 3 weeks.    You can take Tylenol and/or Ibuprofen as needed for fever reduction and pain relief.    For sore throat: try warm salt water gargles, Mucinex sore throat cough drops or cepacol lozenges, throat spray, warm tea or water with lemon/honey, popsicles or ice, or OTC cold relief medicine for throat discomfort. You can also purchase chloraseptic spray at the pharmacy or dollar store.   For congestion: take a daily anti-histamine like Zyrtec, Claritin, and a oral decongestant, such as pseudoephedrine.  You can also use Flonase 1-2 sprays in each nostril daily. Afrin is also a good option, if you do not have high blood pressure.     It is important to stay hydrated: drink plenty of fluids (water, gatorade/powerade/pedialyte, juices, or teas) to keep your throat moisturized and help further relieve irritation/discomfort.    Return or go to the Emergency Department if symptoms worsen or do not improve in the next few days      ED Prescriptions   None    PDMP not reviewed this encounter.   Katha Cabal, DO 05/19/23 0945

## 2023-05-19 NOTE — Discharge Instructions (Signed)
Your strep test is negative.  You have a viral respiratory infection (even possibly influenza or COVID) that will gradually improve over the next 7-10 days. Cough may last up to 3 weeks.    You can take Tylenol and/or Ibuprofen as needed for fever reduction and pain relief.    For sore throat: try warm salt water gargles, Mucinex sore throat cough drops or cepacol lozenges, throat spray, warm tea or water with lemon/honey, popsicles or ice, or OTC cold relief medicine for throat discomfort. You can also purchase chloraseptic spray at the pharmacy or dollar store.   For congestion: take a daily anti-histamine like Zyrtec, Claritin, and a oral decongestant, such as pseudoephedrine.  You can also use Flonase 1-2 sprays in each nostril daily. Afrin is also a good option, if you do not have high blood pressure.    It is important to stay hydrated: drink plenty of fluids (water, gatorade/powerade/pedialyte, juices, or teas) to keep your throat moisturized and help further relieve irritation/discomfort.    Return or go to the Emergency Department if symptoms worsen or do not improve in the next few days
